# Patient Record
Sex: Female | Born: 1965 | Race: White | Hispanic: No | Marital: Married | Smoking: Never smoker
Health system: Southern US, Community
[De-identification: ages and names within clinical notes are randomized; demographics above are authoritative.]

## PROBLEM LIST (undated history)

## (undated) DIAGNOSIS — J45909 Unspecified asthma, uncomplicated: Secondary | ICD-10-CM

## (undated) DIAGNOSIS — M25519 Pain in unspecified shoulder: Secondary | ICD-10-CM

## (undated) DIAGNOSIS — L509 Urticaria, unspecified: Secondary | ICD-10-CM

## (undated) DIAGNOSIS — D229 Melanocytic nevi, unspecified: Secondary | ICD-10-CM

## (undated) DIAGNOSIS — M549 Dorsalgia, unspecified: Secondary | ICD-10-CM

## (undated) DIAGNOSIS — K589 Irritable bowel syndrome without diarrhea: Secondary | ICD-10-CM

## (undated) DIAGNOSIS — J069 Acute upper respiratory infection, unspecified: Secondary | ICD-10-CM

## (undated) HISTORY — DX: Unspecified asthma, uncomplicated: J45.909

## (undated) HISTORY — DX: Melanocytic nevi, unspecified: D22.9

## (undated) HISTORY — DX: Irritable bowel syndrome without diarrhea: K58.9

## (undated) HISTORY — DX: Urticaria, unspecified: L50.9

## (undated) HISTORY — DX: Pain in unspecified shoulder: M25.519

## (undated) HISTORY — PX: OTHER SURGICAL HISTORY: SHX169

## (undated) HISTORY — PX: CERVICAL CONE BIOPSY: SUR198

## (undated) HISTORY — DX: Acute upper respiratory infection, unspecified: J06.9

## (undated) HISTORY — DX: Dorsalgia, unspecified: M54.9

---
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
## 2006-04-07 ENCOUNTER — Other Ambulatory Visit: Admission: RE | Admit: 2006-04-07 | Discharge: 2006-04-07 | Payer: Self-pay | Admitting: Internal Medicine

## 2006-05-09 ENCOUNTER — Encounter: Admission: RE | Admit: 2006-05-09 | Discharge: 2006-05-09 | Payer: Self-pay | Admitting: Internal Medicine

## 2007-04-21 ENCOUNTER — Other Ambulatory Visit: Admission: RE | Admit: 2007-04-21 | Discharge: 2007-04-21 | Payer: Self-pay | Admitting: Family Medicine

## 2007-07-05 ENCOUNTER — Encounter: Admission: RE | Admit: 2007-07-05 | Discharge: 2007-07-05 | Payer: Self-pay | Admitting: Family Medicine

## 2008-05-14 ENCOUNTER — Other Ambulatory Visit: Admission: RE | Admit: 2008-05-14 | Discharge: 2008-05-14 | Payer: Self-pay | Admitting: Family Medicine

## 2008-07-24 ENCOUNTER — Encounter: Admission: RE | Admit: 2008-07-24 | Discharge: 2008-07-24 | Payer: Self-pay | Admitting: Family Medicine

## 2009-06-04 ENCOUNTER — Other Ambulatory Visit: Admission: RE | Admit: 2009-06-04 | Discharge: 2009-06-04 | Payer: Self-pay | Admitting: Family Medicine

## 2009-08-27 ENCOUNTER — Ambulatory Visit (HOSPITAL_COMMUNITY): Admission: RE | Admit: 2009-08-27 | Discharge: 2009-08-27 | Payer: Self-pay | Admitting: Family Medicine

## 2010-03-06 HISTORY — PX: OTHER SURGICAL HISTORY: SHX169

## 2010-10-01 ENCOUNTER — Ambulatory Visit (HOSPITAL_COMMUNITY): Admission: RE | Admit: 2010-10-01 | Discharge: 2010-10-01 | Payer: Self-pay | Admitting: Family Medicine

## 2011-08-24 ENCOUNTER — Ambulatory Visit (HOSPITAL_BASED_OUTPATIENT_CLINIC_OR_DEPARTMENT_OTHER)
Admission: RE | Admit: 2011-08-24 | Discharge: 2011-08-24 | Disposition: A | Discharge status: Home / Self Care | Payer: BC Managed Care – PPO | Source: Ambulatory Visit | Attending: Critical Care Medicine | Admitting: Critical Care Medicine

## 2011-08-24 ENCOUNTER — Encounter: Payer: Self-pay | Admitting: Critical Care Medicine

## 2011-08-24 ENCOUNTER — Ambulatory Visit (INDEPENDENT_AMBULATORY_CARE_PROVIDER_SITE_OTHER): Payer: BC Managed Care – PPO | Admitting: Critical Care Medicine

## 2011-08-24 DIAGNOSIS — K219 Gastro-esophageal reflux disease without esophagitis: Secondary | ICD-10-CM

## 2011-08-24 DIAGNOSIS — R05 Cough: Secondary | ICD-10-CM | POA: Insufficient documentation

## 2011-08-24 DIAGNOSIS — R059 Cough, unspecified: Secondary | ICD-10-CM | POA: Insufficient documentation

## 2011-08-24 DIAGNOSIS — R062 Wheezing: Secondary | ICD-10-CM | POA: Insufficient documentation

## 2011-08-24 MED ORDER — BENZONATATE 100 MG PO CAPS: ORAL_CAPSULE | ORAL | Status: AC

## 2011-08-24 MED ORDER — HYDROCODONE-HOMATROPINE 5-1.5 MG/5ML PO SYRP: 5.0000 mL | ORAL_SOLUTION | Freq: Four times a day (QID) | ORAL | Status: AC | PRN

## 2011-08-24 MED ORDER — OMEPRAZOLE 20 MG PO CPDR: 20.0000 mg | DELAYED_RELEASE_CAPSULE | Freq: Every day | ORAL | Status: DC

## 2011-08-24 MED ORDER — PREDNISONE 10 MG PO TABS: ORAL_TABLET | ORAL | Status: DC

## 2011-08-24 NOTE — Progress Notes (Signed)
Subjective:    Patient ID: Melissa Mcintyre, female    DOB: 13-Jun-1966, 45 y.o.   MRN: [id number] 45 y.o.WF with cyclical cough HPI Comments: This is the 4th episode over the past 4years , every other year.    Cough This is a recurrent problem. The current episode started 1 to 4 weeks ago. The problem has been unchanged. The problem occurs every few minutes (worse with talking ). The cough is non-productive. Associated symptoms include postnasal drip and wheezing. Pertinent negatives include no chest pain, ear congestion, ear pain, fever, headaches, heartburn, hemoptysis, myalgias, nasal congestion, rhinorrhea or sore throat. Associated symptoms comments: No dysphagia Not hoarse. The symptoms are aggravated by lying down, fumes, dust, pollens and stress. She has tried steroid inhaler, a beta-agonist inhaler and oral steroids for the symptoms. The treatment provided no relief. There is no history of asthma, bronchiectasis, bronchitis, COPD, emphysema, environmental allergies or pneumonia.   Past Medical History  Diagnosis Date  . Genital herpes   . IBS (irritable bowel syndrome)   . Atypical nevi   . Shoulder pain     intermittent  . Pain, upper back     intermittent     Family History  Problem Relation Age of Onset  . Prostate cancer Father   . Hyperlipidemia    . Hypertension Mother   . Skin cancer Mother   . Asthma Mother   . Asthma Maternal Uncle   . Asthma Paternal Uncle      History   Social History  . Marital Status: Married    Spouse Name: N/A    Number of Children: 2  . Years of Education: N/A   Occupational History  . Not on file.   Social History Main Topics  . Smoking status: Never Smoker   . Smokeless tobacco: Never Used  . Alcohol Use: Yes     2 weekly  . Drug Use: No  . Sexually Active: Not on file   Other Topics Concern  . Not on file   Social History Narrative  . No narrative on file     Allergies  Allergen Reactions  . Adhesive (Tape)   .  Demerol     vomiting  . Flagyl (Metronidazole)     Itching, joint swelling  . Latex     Blistering and rashes     Outpatient Prescriptions Prior to Visit  Medication Sig Dispense Refill  . valACYclovir (VALTREX) 500 MG tablet Take 500 mg by mouth 2 (two) times daily. For 3 days as needed for outbreak       . HYDROcodone-homatropine (HYCODAN) 5-1.5 MG/5ML syrup Take 5 mLs by mouth every 6 (six) hours as needed. For cough       . montelukast (SINGULAIR) 10 MG tablet Take 10 mg by mouth at bedtime.            Review of Systems  Constitutional: Negative for fever and fatigue.  HENT: Positive for postnasal drip. Negative for ear pain, nosebleeds, congestion, sore throat, rhinorrhea, sneezing, dental problem and sinus pressure.   Respiratory: Positive for cough and wheezing. Negative for hemoptysis, choking and chest tightness.   Cardiovascular: Negative for chest pain, palpitations and leg swelling.  Gastrointestinal: Negative for heartburn.  Musculoskeletal: Negative for myalgias.  Neurological: Negative for headaches.  Hematological: Negative for environmental allergies. Does not bruise/bleed easily.       Objective:   Physical Exam  Filed Vitals:   08/24/11 1523  BP: 112/60  Pulse: 71  Temp: 97.8 F (36.6 C)  TempSrc: Oral  Height: 5\' 1"  (1.549 m)  Weight: 110 lb (49.896 kg)  SpO2: 99%    Gen: Pleasant, well-nourished, in no distress,  normal affect  ENT: No lesions,  mouth clear,  oropharynx clear, +  postnasal drip  Neck: No JVD, no TMG, no carotid bruits  Lungs: No use of accessory muscles, no dullness to percussion, clear without rales or rhonchi, mild pseudowheeze  Cardiovascular: RRR, heart sounds normal, no murmur or gallops, no peripheral edema  Abdomen: soft and NT, no HSM,  BS normal  Musculoskeletal: No deformities, no cyanosis or clubbing  Neuro: alert, non focal  Skin: Warm, no lesions or rashes  08/24/11 CXR: NAD     Assessment & Plan:    Cough Cyclical cough syndrome with associated dyspnea likely on the basis of gastroesophageal reflux disease and postnasal drip syndrome from allergic rhinitis I doubt true asthma  Plan Stop pulmicort, singulair, xopenex Prednisone 10mg  Take 4 for two days three for two days two for two days one for two days Omeprazole one daily 1/2 hour before a meal Follow reflux diet Follow cyclic cough protocol with hycodan/tessalon pearles Pulmonary function study will be obtained Note CXR showed No active disease     Updated Medication List Outpatient Encounter Prescriptions as of 08/24/2011  Medication Sig Dispense Refill  . Ascorbic Acid (VITAMIN C) 1000 MG tablet Take 1,000 mg by mouth. 1-2 times weekly       . LYSINE PO Take by mouth as needed.        . polycarbophil (FIBERCON) 625 MG tablet Take 625 mg by mouth as needed.        . valACYclovir (VALTREX) 500 MG tablet Take 500 mg by mouth 2 (two) times daily. For 3 days as needed for outbreak       . DISCONTD: budesonide (PULMICORT) 180 MCG/ACT inhaler Inhale 1 puff into the lungs 2 (two) times daily.        Marland Kitchen DISCONTDPauline Aus HFA 45 MCG/ACT inhaler Inhale 2 puffs into the lungs Every 6 hours as needed.      . benzonatate (TESSALON) 100 MG capsule Take 1 - 2 every 4 hours per cyclic cough protocol  90 capsule  4  . HYDROcodone-homatropine (HYCODAN) 5-1.5 MG/5ML syrup Take 5 mLs by mouth every 6 (six) hours as needed for cough.  180 mL  0  . omeprazole (PRILOSEC) 20 MG capsule Take 1 capsule (20 mg total) by mouth daily.  30 capsule  1  . predniSONE (DELTASONE) 10 MG tablet Take 4 for two days three for two days two for two days one for two days  20 tablet  0  . DISCONTD: HYDROcodone-homatropine (HYCODAN) 5-1.5 MG/5ML syrup Take 5 mLs by mouth every 6 (six) hours as needed. For cough       . DISCONTD: montelukast (SINGULAIR) 10 MG tablet Take 10 mg by mouth at bedtime.

## 2011-08-24 NOTE — Patient Instructions (Addendum)
Stop pulmicort, singulair, xopenex Prednisone 10mg  Take 4 for two days three for two days two for two days one for two days Omeprazole one daily 1/2 hour before a meal Follow reflux diet Follow cyclic cough protocol with hycodan/tessalon pearles Pulmonary function study will be obtained A chest xray will be obtained Return 1 month [place]

## 2011-08-25 ENCOUNTER — Telehealth: Payer: Self-pay | Admitting: Orthopedic Surgery

## 2011-08-25 DIAGNOSIS — J4540 Moderate persistent asthma, uncomplicated: Secondary | ICD-10-CM | POA: Insufficient documentation

## 2011-08-25 NOTE — Telephone Encounter (Signed)
Called once and got disconnected. Called back and had to leave a msg to return our call.

## 2011-08-25 NOTE — Assessment & Plan Note (Addendum)
Cyclical cough syndrome with associated dyspnea likely on the basis of gastroesophageal reflux disease and postnasal drip syndrome from allergic rhinitis I doubt true asthma  Plan Stop pulmicort, singulair, xopenex Prednisone 10mg  Take 4 for two days three for two days two for two days one for two days Omeprazole one daily 1/2 hour before a meal Follow reflux diet Follow cyclic cough protocol with hycodan/tessalon pearles Pulmonary function study will be obtained Note CXR showed No active disease

## 2011-08-26 NOTE — Telephone Encounter (Signed)
Called, spoke with pt to inform her of cxr results.  She was also informed PW wants her to reduce the cough syrup to 1/2 tsp bid for one day only and reduce the prednisone to 2 tablets today for one day then one tabe for one day then stop it.  She verbalized understanding of both of these instructions.  She then stated she had already taken a tessalon pearl about 2 hours ago and wanted to know if this would affect the cough syrup.  I discussed this with PW and advised pt she can go ahead and take the cough syrup that this would not affect the tessalon pearles.  She verbalized understanding of this.    Pt is aware PW wants her to see TP tomorrow.  However, she can only come in the morning.  Spoke with Verlon Au regarding this - she will discuss with TP to see if pt can be worked in tomorrow am.  Pls advise. Thanks!

## 2011-08-26 NOTE — Progress Notes (Signed)
Quick Note:  Called, spoke with pt. She was informed cxr is normal per PW. She verbalized understanding of this. ______

## 2011-08-26 NOTE — Telephone Encounter (Signed)
PT'S MOM RETURNED CALL. PER LESLIE I ADVISED HER THAT PT CAN BE SEEN AT 10AM TOMORROW WITH TP. MOM WILL CALL PT TO LET HER KNOW. I WILL CLOSE ENCOUNTER PER LESLIE. Hazel Sams

## 2011-08-26 NOTE — Progress Notes (Signed)
Quick Note:  Notify the patient that the chest Xray is normal  ______

## 2011-08-26 NOTE — Telephone Encounter (Signed)
Call the patient :  Tell her to reduce the cough syrup to 1/2 tsp twice daily for one day only Reduce prednisone to 2 tabs today for one day then one tab per day for one day then stop  Have her come in to see tammy parrett tomorrow for recheck

## 2011-08-26 NOTE — Telephone Encounter (Signed)
Per TP, okay to use 10 am held appt time for tomorrow. I have called pt's mother, Lupita Leash, and LMTCB. Will keep spot blocked until she calls to confirm appt.

## 2011-08-26 NOTE — Telephone Encounter (Signed)
lmomtcb for donna 

## 2011-08-26 NOTE — Telephone Encounter (Signed)
Please call back asap [phone] [facility]

## 2011-08-26 NOTE — Telephone Encounter (Signed)
I spoke with pt mother and she states pt emailed her stating she is feeling some better. Mother states the pt is not able to sleep due to the prednisone. Pt is only sleep 3-4 hrs a night. Mother states the pt heads swim at night when she is in bed and is very twitchy. Mother states pt emailed her this morning advising she has had no morning cough so she thinks it is getting better. Lupita Leash (pt mother) would like Dr. Delford Field to call her directly b/c she states she has more to discuss with him. She would not elaborate on what and states just have Dr. Delford Field call her. Please advise Dr. Delford Field. Thanks  Carver Fila, CMA

## 2011-08-27 ENCOUNTER — Ambulatory Visit (INDEPENDENT_AMBULATORY_CARE_PROVIDER_SITE_OTHER): Payer: BC Managed Care – PPO | Admitting: Adult Health

## 2011-08-27 VITALS — BP 108/60 | HR 60 | Temp 97.6°F | Ht 61.0 in | Wt 109.4 lb

## 2011-08-27 DIAGNOSIS — R05 Cough: Secondary | ICD-10-CM

## 2011-08-27 DIAGNOSIS — R059 Cough, unspecified: Secondary | ICD-10-CM

## 2011-08-27 NOTE — Patient Instructions (Addendum)
Delsym 2 tsp Twice daily   Tessalon 2 capsules Three times a day   Hycodan 1-2 tsp every 4-6 hr As needed  For breakthrough  THE GOAL IS NOT COUGHING OR THROAT CLEARING  Continue On Omeprazole -Prilosec 20mg  daily -before meal  Add Pepcid 20mg  At bedtime   Voice rest Non MINT candies to prevent cough or throat clearing  Add Zyrtec 10mg  daily in am  Add Chlortab 4mg  2 tabs At bedtime   Stop prednisone  follow up Dr. Delford Field  As planned in 3 weeks.  Please contact office for sooner follow up if symptoms do not improve or worsen or seek emergency care

## 2011-08-27 NOTE — Progress Notes (Signed)
Subjective:    Patient ID: Melissa Mcintyre, female    DOB: 02-20-1966, 45 y.o.   MRN: [id number]  HPI Review of Systems Subjective:    Patient ID: Melissa Mcintyre, female    DOB: Nov 05, 1966, 45 y.o.   MRN: [id number] 45 y.o.WF with cyclical cough that began in 2008 -initial pulmonary consult on 08/24/11   08/24/11 Initial Pulmonary Consult This is a recurrent problem. The current episode started 1 to 4 weeks ago. The problem has been unchanged. The problem occurs every few minutes (worse with talking ). The cough is non-productive. Associated symptoms include postnasal drip and wheezing. Pertinent negatives include no chest pain, ear congestion, ear pain, fever, headaches, heartburn, hemoptysis, myalgias, nasal congestion, rhinorrhea or sore throat. Associated symptoms comments: No dysphagia Not hoarse. The symptoms are aggravated by lying down, fumes, dust, pollens and stress. She has tried steroid inhaler, a beta-agonist inhaler and oral steroids for the symptoms. The treatment provided no relief. There is no history of asthma, bronchiectasis, bronchitis, COPD, emphysema, environmental allergies or pneumonia.  >>started on cyclical cough protocol and inhalers were stopped.   08/27/11 Acute OV  PT returns very upset that her cough is not better. She is very emotional with crying episodes and very tearful throughout the exam. She is upset she is not sleeping, she can not stop coughing.  She is taking the steroids several times a day on the taper schedule and using tessalon and hydromet As needed  Without help. CXR recently is unremarkable. She is a never smoker.  No recent travel. No unusual hobbies.   Past Medical History  Diagnosis Date  . Genital herpes   . IBS (irritable bowel syndrome)   . Atypical nevi   . Shoulder pain     intermittent  . Pain, upper back     intermittent     Family History  Problem Relation Age of Onset  . Prostate cancer Father   . Hyperlipidemia    .  Hypertension Mother   . Skin cancer Mother   . Asthma Mother   . Asthma Maternal Uncle   . Asthma Paternal Uncle      History   Social History  . Marital Status: Married    Spouse Name: N/A    Number of Children: 2  . Years of Education: N/A   Occupational History  . Not on file.   Social History Main Topics  . Smoking status: Never Smoker   . Smokeless tobacco: Never Used  . Alcohol Use: Yes     2 weekly  . Drug Use: No  . Sexually Active: Not on file   Other Topics Concern  . Not on file   Social History Narrative  . No narrative on file     Allergies  Allergen Reactions  . Adhesive (Tape)   . Demerol     vomiting  . Flagyl (Metronidazole)     Itching, joint swelling  . Latex     Blistering and rashes     Outpatient Prescriptions Prior to Visit  Medication Sig Dispense Refill  . valACYclovir (VALTREX) 500 MG tablet Take 500 mg by mouth 2 (two) times daily. For 3 days as needed for outbreak       . HYDROcodone-homatropine (HYCODAN) 5-1.5 MG/5ML syrup Take 5 mLs by mouth every 6 (six) hours as needed. For cough       . montelukast (SINGULAIR) 10 MG tablet Take 10 mg by mouth at bedtime.  Review of Systems  Constitutional: Negative for fever and fatigue.  HENT: Positive for postnasal drip. Negative for ear pain, nosebleeds, congestion, sore throat, rhinorrhea, sneezing, dental problem and sinus pressure.  + throat clearing  Respiratory: Positive for cough -dry . Negative for hemoptysis, choking and chest tightness.   Cardiovascular: Negative for chest pain, palpitations and leg swelling.  Gastrointestinal: Negative for heartburn.  Musculoskeletal: Negative for myalgias.  Neurological: Negative for headaches.  Hematological: Negative for environmental allergies. Does not bruise/bleed easily.       Objective:   Physical Exam    Gen: Pleasant, well-nourished, in no distress, tearful   ENT: No lesions,  mouth clear,  oropharynx clear, +   postnasal drip  Neck: No JVD, no TMG, no carotid bruits  Lungs: No use of accessory muscles, no dullness to percussion, coarse BS no wheezing   Cardiovascular: RRR, heart sounds normal, no murmur or gallops, no peripheral edema  Abdomen: soft and NT, no HSM,  BS normal  Musculoskeletal: No deformities, no cyanosis or clubbing  Neuro: alert, non focal  Skin: Warm, no lesions or rashes  08/24/11 CXR: NAD     Assessment & Plan:   Cough Cyclical cough syndrome with associated dyspnea likely on the basis of gastroesophageal reflux disease and postnasal drip syndrome from allergic rhinitis I doubt true asthma  Plan Stop pulmicort, singulair, xopenex Prednisone 10mg  Take 4 for two days three for two days two for two days one for two days Omeprazole one daily 1/2 hour before a meal Follow reflux diet Follow cyclic cough protocol with hycodan/tessalon pearles Pulmonary function study will be obtained Note CXR showed No active disease     Updated Medication List Outpatient Encounter Prescriptions as of 08/24/2011  Medication Sig Dispense Refill  . Ascorbic Acid (VITAMIN C) 1000 MG tablet Take 1,000 mg by mouth. 1-2 times weekly       . LYSINE PO Take by mouth as needed.        . polycarbophil (FIBERCON) 625 MG tablet Take 625 mg by mouth as needed.        . valACYclovir (VALTREX) 500 MG tablet Take 500 mg by mouth 2 (two) times daily. For 3 days as needed for outbreak       . DISCONTD: budesonide (PULMICORT) 180 MCG/ACT inhaler Inhale 1 puff into the lungs 2 (two) times daily.        Marland Kitchen DISCONTDPauline Aus HFA 45 MCG/ACT inhaler Inhale 2 puffs into the lungs Every 6 hours as needed.      . benzonatate (TESSALON) 100 MG capsule Take 1 - 2 every 4 hours per cyclic cough protocol  90 capsule  4  . HYDROcodone-homatropine (HYCODAN) 5-1.5 MG/5ML syrup Take 5 mLs by mouth every 6 (six) hours as needed for cough.  180 mL  0  . omeprazole (PRILOSEC) 20 MG capsule Take 1 capsule (20 mg total)  by mouth daily.  30 capsule  1  . predniSONE (DELTASONE) 10 MG tablet Take 4 for two days three for two days two for two days one for two days  20 tablet  0  . DISCONTD: HYDROcodone-homatropine (HYCODAN) 5-1.5 MG/5ML syrup Take 5 mLs by mouth every 6 (six) hours as needed. For cough       . DISCONTD: montelukast (SINGULAIR) 10 MG tablet Take 10 mg by mouth at bedtime.                Objective:   Physical Exam  Assessment & Plan:

## 2011-08-28 NOTE — Assessment & Plan Note (Addendum)
Cyclical cough with unremarkable cxr in a never smoker.  Pt has upcoming PFTS  Will aim tx at cough control with GERD and Rhnitis prevention   Plan;  Delsym 2 tsp Twice daily   Tessalon 2 capsules Three times a day   Hycodan 1-2 tsp every 4-6 hr As needed  For breakthrough  THE GOAL IS NOT COUGHING OR THROAT CLEARING  Continue On Omeprazole -Prilosec 20mg  daily -before meal  Add Pepcid 20mg  At bedtime   Voice rest Non MINT candies to prevent cough or throat clearing  Add Zyrtec 10mg  daily in am  Add Chlortab 4mg  2 tabs At bedtime   Stop prednisone (unable to tolerate due to mood and sleep diprivation) follow up Dr. Delford Field  As planned in 3 weeks.  Please contact office for sooner follow up if symptoms do not improve or worsen or seek emergency care

## 2011-08-30 ENCOUNTER — Telehealth: Payer: Self-pay | Admitting: Adult Health

## 2011-08-30 DIAGNOSIS — R05 Cough: Secondary | ICD-10-CM

## 2011-08-30 DIAGNOSIS — R059 Cough, unspecified: Secondary | ICD-10-CM

## 2011-08-30 NOTE — Telephone Encounter (Signed)
I spoke with pt and she states she has been feeling dizzy and lightheaded x Friday. Pt states she is not sure which medication is causing this. She states she did not take any cough syrup yesterday to see if that was the cause and she states it was not. Pt states she is taking delsym cough syrup, tessalon pearles, zyrtec, chlorpheniramine tablets at bedtime. Pt is wanting to see what Dr. Delford Field thinks may be causing this. Please advise Dr. Delford Field. Thanks  Carver Fila, CMA

## 2011-08-30 NOTE — Telephone Encounter (Signed)
I spoke to pt and told her to:  Use delsym only prn Stay on tessalon Use zyrtec prn Stay on pepcid/omeprazole Stay on chlorpheniramine  I want her to get pfts asap and moved her order up

## 2011-08-30 NOTE — Telephone Encounter (Signed)
Called, spoke with pt.  She is aware PW wants her to move PFTs up and have them done this week.  This was scheduled for this Wednesday, Sept 26 at 1 pm at [place] office -- pt aware and verbalized understanding of this.

## 2011-09-01 ENCOUNTER — Ambulatory Visit (INDEPENDENT_AMBULATORY_CARE_PROVIDER_SITE_OTHER): Payer: BC Managed Care – PPO | Admitting: Critical Care Medicine

## 2011-09-01 DIAGNOSIS — R05 Cough: Secondary | ICD-10-CM

## 2011-09-01 DIAGNOSIS — R059 Cough, unspecified: Secondary | ICD-10-CM

## 2011-09-01 LAB — PULMONARY FUNCTION TEST

## 2011-09-01 NOTE — Progress Notes (Signed)
PFT done today. 

## 2011-09-02 ENCOUNTER — Telehealth: Payer: Self-pay | Admitting: Critical Care Medicine

## 2011-09-02 DIAGNOSIS — R059 Cough, unspecified: Secondary | ICD-10-CM

## 2011-09-02 DIAGNOSIS — R05 Cough: Secondary | ICD-10-CM

## 2011-09-02 NOTE — Telephone Encounter (Signed)
Pfts show possible asthma I left msg on cell phone  Pls make sure pt has Rov next week to regroup with pft results, [place] office ok if she can come here to [place]

## 2011-09-03 NOTE — Telephone Encounter (Signed)
ATC again, NA and unable to leave a msg

## 2011-09-03 NOTE — Telephone Encounter (Signed)
Called pt at 09/03/2011 1638 , no answer x 2 . Unable to leave message  Please call back and notify to follow up as Dr. Delford Field  Recommended.

## 2011-09-03 NOTE — Telephone Encounter (Signed)
Spoke with pt.  She is asking whether or not to take tessalon pearles. I asked her if she was still coughing and she states only occ. I advised that she can use the med as needed for the cough, but if not coughing she does not have to take. She asks if needs to start inhaler b/c she has been dxed with asthma. Looking at the result note per PW we are not sure she has asthma and since he has not old her to start inhaler yet, she should wait until ov with PW. She verbalized understanding and states will keep appt.

## 2011-09-03 NOTE — Telephone Encounter (Signed)
Called, spoke with pt.  States she did get Dr. Lynelle Doctor message but was very upset because he was supposed to call her on the [phone] number and bc he called the number listed as her cell phone number, which is actually her home number, she did not get a chance to actually speak with him.  I apologized for this and informed pt of PW's statement.  She verbalized understanding of this and went ahead and scheduled f/u with PW for 09/09/11 in [place] but she wanting to speak with PW in the meantime regarding this because she thinks she might need to go back on her inhaler - states she is still having dizziness off and on during the day and coughing spells.  I advised pt that PW was not back in the office until Wednesday but she would still like to discuss this with someone prior to her appt.  Tammy, you seen pt on 08/30/11 - she had the PFTs done on Wednesday which I have with me.  Pt wanting to speak with PW but he is off -- will you call her to see if you can help her with her questions regarding inhaler?  I tried to get more information out of her regarding this but she would rather speak with someone instead of going "back and forth."  She is requesting call back on the [phone].  Thank you.

## 2011-09-09 ENCOUNTER — Encounter: Payer: Self-pay | Admitting: Critical Care Medicine

## 2011-09-09 ENCOUNTER — Ambulatory Visit (INDEPENDENT_AMBULATORY_CARE_PROVIDER_SITE_OTHER): Payer: BC Managed Care – PPO | Admitting: Critical Care Medicine

## 2011-09-09 VITALS — BP 110/60 | HR 55 | Temp 98.3°F | Ht 61.0 in | Wt 109.0 lb

## 2011-09-09 DIAGNOSIS — J45909 Unspecified asthma, uncomplicated: Secondary | ICD-10-CM

## 2011-09-09 MED ORDER — AEROCHAMBER MV MISC: Status: AC

## 2011-09-09 MED ORDER — LEVALBUTEROL TARTRATE 45 MCG/ACT IN AERO: 2.0000 | INHALATION_SPRAY | RESPIRATORY_TRACT | Status: AC | PRN

## 2011-09-09 MED ORDER — FLUTICASONE FUROATE 27.5 MCG/SPRAY NA SUSP: 2.0000 | Freq: Every day | NASAL | Status: DC

## 2011-09-09 MED ORDER — METHYLPREDNISOLONE ACETATE 80 MG/ML IJ SUSP
120.0000 mg | Freq: Once | INTRAMUSCULAR | Status: AC
  Administered 2011-09-09: 120 mg via INTRAMUSCULAR

## 2011-09-09 MED ORDER — BECLOMETHASONE DIPROPIONATE 80 MCG/ACT IN AERS: 2.0000 | INHALATION_SPRAY | Freq: Two times a day (BID) | RESPIRATORY_TRACT | Status: DC

## 2011-09-09 NOTE — Progress Notes (Deleted)
Subjective:    Patient ID: Melissa Mcintyre, female    DOB: 07-Jun-1966, 45 y.o.   MRN: [id number]  HPI  Review of Systems  Subjective:    Patient ID: Melissa Mcintyre, female    DOB: February 24, 1966, 45 y.o.   MRN: [id number] 45 y.o.WF with cyclical cough that began in 2008 -initial pulmonary consult on 08/24/11   08/24/11 Initial Pulmonary Consult This is a recurrent problem. The current episode started 1 to 4 weeks ago. The problem has been unchanged. The problem occurs every few minutes (worse with talking ). The cough is non-productive. Associated symptoms include postnasal drip and wheezing. Pertinent negatives include no chest pain, ear congestion, ear pain, fever, headaches, heartburn, hemoptysis, myalgias, nasal congestion, rhinorrhea or sore throat. Associated symptoms comments: No dysphagia Not hoarse. The symptoms are aggravated by lying down, fumes, dust, pollens and stress. She has tried steroid inhaler, a beta-agonist inhaler and oral steroids for the symptoms. The treatment provided no relief. There is no history of asthma, bronchiectasis, bronchitis, COPD, emphysema, environmental allergies or pneumonia.  >>started on cyclical cough protocol and inhalers were stopped.   08/27/11 Acute OV  PT returns very upset that her cough is not better. She is very emotional with crying episodes and very tearful throughout the exam. She is upset she is not sleeping, she can not stop coughing.  She is taking the steroids several times a day on the taper schedule and using tessalon and hydromet As needed  Without help. CXR recently is unremarkable. She is a never smoker.  No recent travel. No unusual hobbies.   09/09/2011  Past Medical History  Diagnosis Date  . Genital herpes   . IBS (irritable bowel syndrome)   . Atypical nevi   . Shoulder pain     intermittent  . Pain, upper back     intermittent     Family History  Problem Relation Age of Onset  . Prostate cancer Father   .  Hyperlipidemia    . Hypertension Mother   . Skin cancer Mother   . Asthma Mother   . Asthma Maternal Uncle   . Asthma Paternal Uncle      History   Social History  . Marital Status: Married    Spouse Name: N/A    Number of Children: 2  . Years of Education: N/A   Occupational History  . Not on file.   Social History Main Topics  . Smoking status: Never Smoker   . Smokeless tobacco: Never Used  . Alcohol Use: Yes     2 weekly  . Drug Use: No  . Sexually Active: Not on file   Other Topics Concern  . Not on file   Social History Narrative  . No narrative on file     Allergies  Allergen Reactions  . Adhesive (Tape)   . Demerol     vomiting  . Flagyl (Metronidazole)     Itching, joint swelling  . Latex     Blistering and rashes     Outpatient Prescriptions Prior to Visit  Medication Sig Dispense Refill  . valACYclovir (VALTREX) 500 MG tablet Take 500 mg by mouth 2 (two) times daily. For 3 days as needed for outbreak       . HYDROcodone-homatropine (HYCODAN) 5-1.5 MG/5ML syrup Take 5 mLs by mouth every 6 (six) hours as needed. For cough       . montelukast (SINGULAIR) 10 MG tablet Take 10 mg by mouth at bedtime.  Review of Systems  Constitutional: Negative for fever and fatigue.  HENT: Positive for postnasal drip. Negative for ear pain, nosebleeds, congestion, sore throat, rhinorrhea, sneezing, dental problem and sinus pressure.  + throat clearing  Respiratory: Positive for cough -dry . Negative for hemoptysis, choking and chest tightness.   Cardiovascular: Negative for chest pain, palpitations and leg swelling.  Gastrointestinal: Negative for heartburn.  Musculoskeletal: Negative for myalgias.  Neurological: Negative for headaches.  Hematological: Negative for environmental allergies. Does not bruise/bleed easily.       Objective:   Physical Exam    Gen: Pleasant, well-nourished, in no distress, tearful   ENT: No lesions,  mouth clear,   oropharynx clear, +  postnasal drip  Neck: No JVD, no TMG, no carotid bruits  Lungs: No use of accessory muscles, no dullness to percussion, coarse BS no wheezing   Cardiovascular: RRR, heart sounds normal, no murmur or gallops, no peripheral edema  Abdomen: soft and NT, no HSM,  BS normal  Musculoskeletal: No deformities, no cyanosis or clubbing  Neuro: alert, non focal  Skin: Warm, no lesions or rashes  08/24/11 CXR: NAD     Assessment & Plan:   Cough Cyclical cough syndrome with associated dyspnea likely on the basis of gastroesophageal reflux disease and postnasal drip syndrome from allergic rhinitis I doubt true asthma  Plan Stop pulmicort, singulair, xopenex Prednisone 10mg  Take 4 for two days three for two days two for two days one for two days Omeprazole one daily 1/2 hour before a meal Follow reflux diet Follow cyclic cough protocol with hycodan/tessalon pearles Pulmonary function study will be obtained Note CXR showed No active disease     Updated Medication List Outpatient Encounter Prescriptions as of 08/24/2011  Medication Sig Dispense Refill  . Ascorbic Acid (VITAMIN C) 1000 MG tablet Take 1,000 mg by mouth. 1-2 times weekly       . LYSINE PO Take by mouth as needed.        . polycarbophil (FIBERCON) 625 MG tablet Take 625 mg by mouth as needed.        . valACYclovir (VALTREX) 500 MG tablet Take 500 mg by mouth 2 (two) times daily. For 3 days as needed for outbreak       . DISCONTD: budesonide (PULMICORT) 180 MCG/ACT inhaler Inhale 1 puff into the lungs 2 (two) times daily.        Marland Kitchen DISCONTDPauline Aus HFA 45 MCG/ACT inhaler Inhale 2 puffs into the lungs Every 6 hours as needed.      . benzonatate (TESSALON) 100 MG capsule Take 1 - 2 every 4 hours per cyclic cough protocol  90 capsule  4  . HYDROcodone-homatropine (HYCODAN) 5-1.5 MG/5ML syrup Take 5 mLs by mouth every 6 (six) hours as needed for cough.  180 mL  0  . omeprazole (PRILOSEC) 20 MG capsule Take 1  capsule (20 mg total) by mouth daily.  30 capsule  1  . predniSONE (DELTASONE) 10 MG tablet Take 4 for two days three for two days two for two days one for two days  20 tablet  0  . DISCONTD: HYDROcodone-homatropine (HYCODAN) 5-1.5 MG/5ML syrup Take 5 mLs by mouth every 6 (six) hours as needed. For cough       . DISCONTD: montelukast (SINGULAIR) 10 MG tablet Take 10 mg by mouth at bedtime.                Objective:   Physical Exam  Assessment & Plan:

## 2011-09-09 NOTE — Assessment & Plan Note (Signed)
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 Need to more fully evaluate allergic factors Note pt failed stopping ICS and singulair and cyclical cough protocol with PPI Plan Start Qvar 80 HFA with aerochamber two puff bid Use xopenex on schedule shortterm then prn Depomedrol 120mg  IM veramyst nasal spray daily Allergy eval dr young

## 2011-09-09 NOTE — Progress Notes (Signed)
Subjective:    Patient ID: Melissa Mcintyre, female    DOB: 03-06-1966, 45 y.o.   MRN: [id number]  HPI 45 y.o. WF with cyclical cough that began in 2008 -initial pulmonary consult on 08/24/11   08/24/11 Initial Pulmonary Consult This is a recurrent problem. The current episode started 1 to 4 weeks ago. The problem has been unchanged. The problem occurs every few minutes (worse with talking ). The cough is non-productive. Associated symptoms include postnasal drip and wheezing. Pertinent negatives include no chest pain, ear congestion, ear pain, fever, headaches, heartburn, hemoptysis, myalgias, nasal congestion, rhinorrhea or sore throat. Associated symptoms comments: No dysphagia Not hoarse. The symptoms are aggravated by lying down, fumes, dust, pollens and stress. She has tried steroid inhaler, a beta-agonist inhaler and oral steroids for the symptoms. The treatment provided no relief. There is no history of asthma, bronchiectasis, bronchitis, COPD, emphysema, environmental allergies or pneumonia.  >>started on cyclical cough protocol and inhalers were stopped.   08/27/11 Acute OV  PT returns very upset that her cough is not better. She is very emotional with crying episodes and very tearful throughout the exam. She is upset she is not sleeping, she can not stop coughing.  She is taking the steroids several times a day on the taper schedule and using tessalon and hydromet As needed  Without help. CXR recently is unremarkable. She is a never smoker.  No recent travel. No unusual hobbies.   09/09/2011 Pt unimproved. She did not tolerate the cyclical cough protocol and PPI did not help. PFTs show reactive airway disease. Pt off all inhalers now.  Pt desires an allergy eval.  Prednisone caused significant side effects. No f/c/s.     Review of Systems Constitutional:   No  weight loss, night sweats,  Fevers, chills, fatigue, lassitude. HEENT:   No headaches,  Difficulty swallowing,  Tooth/dental  problems,  Sore throat,                No sneezing, itching, ear ache, nasal congestion, +  post nasal drip,   CV:  No chest pain,  Orthopnea, PND, swelling in lower extremities, anasarca, dizziness, palpitations  GI  No heartburn, indigestion, abdominal pain, nausea, vomiting, diarrhea, change in bowel habits, loss of appetite  Resp: Notes  shortness of breath with exertion and  at rest.  No excess mucus, no productive cough,  Notes non-productive cough,  No coughing up of blood.  No change in color of mucus.  Notes wheezing.  No chest wall deformity  Skin: no rash or lesions.  GU: no dysuria, change in color of urine, no urgency or frequency.  No flank pain.  MS:  No joint pain or swelling.  No decreased range of motion.  No back pain.  Psych:  No change in mood or affect. No depression or anxiety.  No memory loss.     Objective:   Physical Exam Filed Vitals:   09/09/11 1027  BP: 110/60  Pulse: 55  Temp: 98.3 F (36.8 C)  TempSrc: Oral  Height: 5\' 1"  (1.549 m)  Weight: 109 lb (49.442 kg)  SpO2: 99%    Gen: angry, agitated WF , in no distress,    ENT: No lesions,  mouth clear,  oropharynx clear, no postnasal drip  Neck: No JVD, no TMG, no carotid bruits  Lungs: No use of accessory muscles, no dullness to percussion, clear without rales or rhonchi, exp wheeze to force exhalation, does not appear to be pseudowheeze  Cardiovascular:  RRR, heart sounds normal, no murmur or gallops, no peripheral edema  Abdomen: soft and NT, no HSM,  BS normal  Musculoskeletal: No deformities, no cyanosis or clubbing  Neuro: alert, non focal  Skin: Warm, no lesions or rashes   PFT Conversion 09/09/2011  FVC 3  FVC PREDICT 3.08  FVC  % Predicted 97  FEV1 2.48  FEV1 PREDICT 2.37  FEV % Predicted 104  FEV1/FVC 82.7  FEV1/FVC PRE 76  FeF 25-75 2.85  FeF 25-75 % Predicted 99  Residual Volume (ml) 1.61  RV % EXPECT 110  DLCO (ml/mmHg sec) 17.3  DLCO % EXPEC 97  DLCO/VA 4.57    DLCO/VA%EXP 111  Note 86% improvement with xopenex in Fef 25-=75 after BD     Assessment & Plan:   Extrinsic asthma, unspecified 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 Need to more fully evaluate allergic factors Note pt failed stopping ICS and singulair and cyclical cough protocol with PPI Plan Start Qvar 80 HFA with aerochamber two puff bid Use xopenex on schedule shortterm then prn Depomedrol 120mg  IM veramyst nasal spray daily Allergy eval dr young      Updated Medication List Outpatient Encounter Prescriptions as of 09/09/2011  Medication Sig Dispense Refill  . Ascorbic Acid (VITAMIN C) 1000 MG tablet Take 1,000 mg by mouth. 1-2 times weekly       . polycarbophil (FIBERCON) 625 MG tablet Take 625 mg by mouth as needed.        . valACYclovir (VALTREX) 500 MG tablet Take 500 mg by mouth 2 (two) times daily. For 3 days as needed for outbreak       . beclomethasone (QVAR) 80 MCG/ACT inhaler Inhale 2 puffs into the lungs 2 (two) times daily.  1 Inhaler  6  . fluticasone (VERAMYST) 27.5 MCG/SPRAY nasal spray Place 2 sprays into the nose daily.  10 g  1  . levalbuterol (XOPENEX HFA) 45 MCG/ACT inhaler Inhale 2 puffs into the lungs every 4 (four) hours as needed for wheezing.  1 Inhaler  2  . Spacer/Aero-Holding Chambers (AEROCHAMBER MV) inhaler Use as instructed  1 each  0  . DISCONTD: LYSINE PO Take by mouth as needed.        Marland Kitchen DISCONTD: omeprazole (PRILOSEC) 20 MG capsule Take 1 capsule (20 mg total) by mouth daily.  30 capsule  1  . DISCONTD: predniSONE (DELTASONE) 10 MG tablet Take 4 for two days three for two days two for two days one for two days  20 tablet  0   Facility-Administered Encounter Medications as of 09/09/2011  Medication Dose Route Frequency  Provider Last Rate Last Dose  . methylPREDNISolone acetate (DEPO-MEDROL) injection 120 mg  120 mg Intramuscular Once Shan Levans, MD   120 mg at 09/09/11 1131

## 2011-09-09 NOTE — Patient Instructions (Signed)
A depo medrol injection was given 120mg   Start Qvar two puff twice daily, use aerochamber Use xopenex two puff three times daily for 3 days then reduce to as needed Start Veramyst two puff each nostril daily Stop omeprazole Stop all cough syrup An allergy evaluation will be obtained with Dr Maple Hudson Return [facility] one month

## 2011-09-15 ENCOUNTER — Other Ambulatory Visit: Payer: Self-pay | Admitting: *Deleted

## 2011-09-15 DIAGNOSIS — J45909 Unspecified asthma, uncomplicated: Secondary | ICD-10-CM

## 2011-09-15 MED ORDER — BECLOMETHASONE DIPROPIONATE 80 MCG/ACT IN AERS: 2.0000 | INHALATION_SPRAY | Freq: Two times a day (BID) | RESPIRATORY_TRACT | Status: DC

## 2011-09-15 NOTE — Progress Notes (Signed)
Received a refill error from [facility] regarding qvar 80.  I called and spoke with [facility] with [employer].  He states they did not receive the qvar rx sent on 09/09/11 and do not have a qvar rx on file for pt.  I gave verbal for this qvar 80 2 puffs bid # 1 x 6.  Barbara Cower verbalized understanding of this.

## 2011-09-20 ENCOUNTER — Ambulatory Visit: Payer: BC Managed Care – PPO | Admitting: Critical Care Medicine

## 2011-10-04 ENCOUNTER — Other Ambulatory Visit: Payer: BC Managed Care – PPO

## 2011-10-04 ENCOUNTER — Ambulatory Visit (INDEPENDENT_AMBULATORY_CARE_PROVIDER_SITE_OTHER): Payer: BC Managed Care – PPO | Admitting: Internal Medicine

## 2011-10-04 ENCOUNTER — Other Ambulatory Visit (HOSPITAL_COMMUNITY)
Admission: RE | Admit: 2011-10-04 | Discharge: 2011-10-04 | Disposition: A | Discharge status: Home / Self Care | Payer: BC Managed Care – PPO | Source: Ambulatory Visit | Attending: Family Medicine | Admitting: Family Medicine

## 2011-10-04 ENCOUNTER — Other Ambulatory Visit: Payer: Self-pay | Admitting: Family Medicine

## 2011-10-04 ENCOUNTER — Encounter: Payer: Self-pay | Admitting: Internal Medicine

## 2011-10-04 VITALS — BP 106/54 | HR 61 | Ht 61.0 in | Wt 111.6 lb

## 2011-10-04 DIAGNOSIS — J45909 Unspecified asthma, uncomplicated: Secondary | ICD-10-CM

## 2011-10-04 DIAGNOSIS — Z0000 Encounter for general adult medical examination without abnormal findings: Secondary | ICD-10-CM | POA: Insufficient documentation

## 2011-10-04 NOTE — Patient Instructions (Signed)
Order- lab  Allergy profile  Dx asthma Schedule return for allergy skin testing off of antihistamines, including otc sleep meds, cough and cold products and cough syrups. Your current asthma meds are ok.

## 2011-10-04 NOTE — Progress Notes (Signed)
10/04/11- 45 year old never smoker referred by Dr. Delford Field for allergy evaluation because of asthma. She has never considered herself and allergic person. She has always had cats and dogs and has a dog now. Home has a basement with musty mildew odor but mold inspection was negative. She denies history of sinus infection otitis, or unusual sensitivity to insects, food, latex or aspirin. She now notices persistent itch and dry cough felt deep to her sternum and mid chest. She has noticed this in August of recent years but this year it has persisted. This is the fourth stretch of such cough in the last few years. Almost always the trigger has seemed to be a bad cold, but cough lingers for months afterwards. The pattern began 6 or 7 years ago. She does not recognize a clear relation to season, weather or exposures. Mother had asthma. 2 uncles had adult onset asthma.  ROS see HPI Constitutional:   No-   weight loss, night sweats, fevers, chills, fatigue, lassitude. HEENT:   No-  headaches, difficulty swallowing, tooth/dental problems, sore throat,       No-  sneezing, itching, ear ache, nasal congestion, post nasal drip,  CV:  No-   chest pain, orthopnea, PND, swelling in lower extremities, anasarca, dizziness, palpitations Resp: No-   shortness of breath with exertion or at rest.              No-   productive cough,  Persistent non-productive cough,  No- coughing up of blood.              No-   change in color of mucus.  No- wheezing.   Skin: No-   rash or lesions. GI:  No-   heartburn, indigestion, abdominal pain, nausea, vomiting, diarrhea,                 change in bowel habits, loss of appetite GU: No-   dysuria, change in color of urine, no urgency or frequency.  No- flank pain. MS:  No-   joint pain or swelling.  No- decreased range of motion.  No- back pain. Neuro-     nothing unusual Psych:  No- change in mood or affect. No depression or anxiety.  No memory loss.  OBJ General- Alert,  Oriented, Affect-appropriate, Distress- none acute, healthy appearing. Skin- rash-none, lesions- none, excoriation- none Lymphadenopathy- none Head- atraumatic            Eyes- Gross vision intact, PERRLA, conjunctivae clear secretions            Ears- Hearing, canals-normal            Nose- Clear, no-Septal dev, mucus, polyps, erosion, perforation             Throat- Mallampati II , mucosa clear , drainage- none, tonsils- atrophic Neck- flexible , trachea midline, no stridor , thyroid nl, carotid no bruit Chest - symmetrical excursion , unlabored           Heart/CV- RRR , no murmur , no gallop  , no rub, nl s1 s2                           - JVD- none , edema- none, stasis changes- none, varices- none           Lung- Clear, but with a persistent slight dry cough. Not wheezing., dullness-none, rub- none           Chest wall-  Abd- tender-no, distended-no, bowel sounds-present, HSM- no Br/ Gen/ Rectal- Not done, not indicated Extrem- cyanosis- none, clubbing, none, atrophy- none, strength- nl Neuro- grossly intact to observation

## 2011-10-04 NOTE — Assessment & Plan Note (Signed)
Adult onset asthma/ cough, consistent with the proposed cyclical mechanism. We are asked to look for a contributing atopic component, and I note her description of itching in chest.  We will send IgE assay now and return off antihistamines for allergy skin test.

## 2011-10-05 LAB — ALLERGY FULL PROFILE
Allergen, D pternoyssinus,d7: <0.1 kU/L (ref <0.35)
Aspergillus fumigatus, IgG: <0.1 kU/L (ref <0.35)
Bahia Grass: <0.1 kU/L (ref <0.35)
Box Elder IgE: <0.1 kU/L (ref <0.35)
D. farinae: <0.1 kU/L (ref <0.35)
Dog Dander: <0.1 kU/L (ref <0.35)
Elm IgE: <0.1 kU/L (ref <0.35)
G005 Rye, Perennial: <0.1 kU/L (ref <0.35)
Goldenrod: <0.1 kU/L (ref <0.35)
Helminthosporium halodes: <0.1 kU/L (ref <0.35)
House Dust Hollister: <0.1 kU/L (ref <0.35)
IgE (Immunoglobulin E), Serum: 13.3 IU/mL (ref 0.0–180.0)
Lamb's Quarters: <0.1 kU/L (ref <0.35)
Plantain: <0.1 kU/L (ref <0.35)

## 2011-10-11 NOTE — Progress Notes (Signed)
Quick Note:  Pt aware of results. ______ 

## 2011-10-13 ENCOUNTER — Other Ambulatory Visit (HOSPITAL_COMMUNITY): Payer: Self-pay | Admitting: Family Medicine

## 2011-10-13 DIAGNOSIS — Z1231 Encounter for screening mammogram for malignant neoplasm of breast: Secondary | ICD-10-CM

## 2011-10-14 ENCOUNTER — Ambulatory Visit: Payer: BC Managed Care – PPO | Admitting: Critical Care Medicine

## 2011-10-19 ENCOUNTER — Ambulatory Visit (HOSPITAL_COMMUNITY)
Admission: RE | Admit: 2011-10-19 | Discharge: 2011-10-19 | Disposition: A | Discharge status: Home / Self Care | Payer: BC Managed Care – PPO | Source: Ambulatory Visit | Attending: Family Medicine | Admitting: Family Medicine

## 2011-10-19 DIAGNOSIS — Z1231 Encounter for screening mammogram for malignant neoplasm of breast: Secondary | ICD-10-CM | POA: Insufficient documentation

## 2011-11-10 ENCOUNTER — Ambulatory Visit: Payer: BC Managed Care – PPO | Admitting: Internal Medicine

## 2011-11-18 ENCOUNTER — Ambulatory Visit: Payer: BC Managed Care – PPO | Admitting: Critical Care Medicine

## 2011-12-15 ENCOUNTER — Telehealth: Payer: Self-pay | Admitting: Internal Medicine

## 2011-12-15 NOTE — Telephone Encounter (Signed)
ATC NA WCB 

## 2011-12-16 ENCOUNTER — Telehealth: Payer: Self-pay | Admitting: Internal Medicine

## 2011-12-16 NOTE — Telephone Encounter (Signed)
LMOM for pt TCB 

## 2011-12-16 NOTE — Telephone Encounter (Signed)
Per Florentina Addison no sooner work in for allergy testing. I made pt aware of this and voiced her understanding and had no questions

## 2011-12-16 NOTE — Telephone Encounter (Signed)
I spoke with pt and she states she needed to reschedule her allergy testing. I transferred pt back to girls upfront to schedule reschedule this

## 2011-12-16 NOTE — Telephone Encounter (Signed)
ATC NA WCB 

## 2012-01-12 ENCOUNTER — Ambulatory Visit: Payer: BC Managed Care – PPO | Admitting: Internal Medicine

## 2012-02-09 ENCOUNTER — Other Ambulatory Visit: Payer: Self-pay | Admitting: Dermatology

## 2012-04-05 ENCOUNTER — Encounter: Payer: Self-pay | Admitting: Internal Medicine

## 2012-04-05 ENCOUNTER — Ambulatory Visit (INDEPENDENT_AMBULATORY_CARE_PROVIDER_SITE_OTHER): Payer: BC Managed Care – PPO | Admitting: Internal Medicine

## 2012-04-05 VITALS — BP 110/68 | HR 63 | Ht 61.0 in | Wt 115.0 lb

## 2012-04-05 DIAGNOSIS — J45909 Unspecified asthma, uncomplicated: Secondary | ICD-10-CM

## 2012-04-05 NOTE — Patient Instructions (Signed)
Consider the dust and pollen control measures we discussed.  I will be happy to see you again as needed.

## 2012-04-05 NOTE — Progress Notes (Signed)
10/04/11- 46 year old never smoker referred by Dr. Delford Field for allergy evaluation because of asthma. She has never considered herself and allergic person. She has always had cats and dogs and has a dog now. Home has a basement with musty mildew odor but mold inspection was negative. She denies history of sinus infection otitis, or unusual sensitivity to insects, food, latex or aspirin. She now notices persistent itch and dry cough felt deep to her sternum and mid chest. She has noticed this in August of recent years but this year it has persisted. This is the fourth stretch of such cough in the last few years. Almost always the trigger has seemed to be a bad cold, but cough lingers for months afterwards. The pattern began 6 or 7 years ago. She does not recognize a clear relation to season, weather or exposures. Mother had asthma. 2 uncles had adult onset asthma.  04/05/12-  68 year old never smoker referred by Dr. Delford Field for allergy evaluation because of asthma. Comes now wishing allergy skin testing. She had had to reschedule several times. Peak season is Fall, with what appear to be prolonged bouts of asthmatic bronchitis lasting weeks to months. She has been well this Spring, noting many around her complaining of allergies. Allergy Profile 10/04/11- Total IgE 13.3, no specific elevations.  Allergy Skin Test-mild to moderate positives particularly for mixed grass, weed and tree pollens, dust mite and some molds.  ROS see HPI Constitutional:   No-   weight loss, night sweats, fevers, chills, fatigue, lassitude. HEENT:   No-  headaches, difficulty swallowing, tooth/dental problems, sore throat,       No-  sneezing, itching, ear ache, nasal congestion, post nasal drip,  CV:  No-   chest pain, orthopnea, PND, swelling in lower extremities, anasarca, dizziness, palpitations Resp: No-   shortness of breath with exertion or at rest.              No-   productive cough,  Persistent non-productive cough,  No-  coughing up of blood.              No-   change in color of mucus.  No- wheezing.   Skin: No-   rash or lesions. GI:  No-   heartburn, indigestion, abdominal pain, nausea, vomiting,  GU:  MS:  No-   joint pain or swelling.   Neuro-     nothing unusual Psych:  No- change in mood or affect. No depression or anxiety.  No memory loss.  OBJ General- Alert, Oriented, Affect-appropriate, Distress- none acute, healthy appearing. Skin- rash-none, lesions- none, excoriation- none Lymphadenopathy- none Head- atraumatic            Eyes- Gross vision intact, PERRLA, conjunctivae clear secretions            Ears- Hearing, canals-normal            Nose- Clear, no-Septal dev, mucus, polyps, erosion, perforation             Throat- Mallampati II , mucosa clear , drainage- none, tonsils- atrophic Neck- flexible , trachea midline, no stridor , thyroid nl, carotid no bruit Chest - symmetrical excursion , unlabored           Heart/CV- RRR , no murmur , no gallop  , no rub, nl s1 s2                           - JVD- none , edema- none,  stasis changes- none, varices- none           Lung- Clear, but with a persistent slight dry cough. Not wheezing., dullness-none, rub- none           Chest wall-  Abd-  Br/ Gen/ Rectal- Not done, not indicated Extrem- cyanosis- none, clubbing, none, atrophy- none, strength- nl Neuro- grossly intact to observation

## 2012-04-08 ENCOUNTER — Encounter: Payer: Self-pay | Admitting: Internal Medicine

## 2012-04-08 NOTE — Assessment & Plan Note (Signed)
IgE were related allergic reactions may be perennial and low grade, more significant if exposures especially heavy. Plan-immunoglobulin levels.

## 2012-04-13 ENCOUNTER — Encounter: Payer: Self-pay | Admitting: Internal Medicine

## 2012-04-20 ENCOUNTER — Telehealth: Payer: Self-pay | Admitting: Critical Care Medicine

## 2012-04-20 NOTE — Telephone Encounter (Signed)
Called spoke with patient who stated that she will not make an appt; is "not going to pay $50 to just come in and have the doctor tell her what medicine to take."  Pt stated that she will go to her PCP for these recommendations.  Apologized to pt for any inconvenience and asked her to please call if anything further is needed.  Pt thanked me and hung up.  Will sign and forward to PEW as FYI.

## 2012-04-20 NOTE — Telephone Encounter (Signed)
Do not reschedule this pt with this department.

## 2012-04-20 NOTE — Telephone Encounter (Signed)
An OV would be helpful to regroup and sort out. Difficult to do over the phone

## 2012-04-20 NOTE — Telephone Encounter (Signed)
Called spoke with patient who reports a dry cough, tickle in the back of her throat when taking a deep breath, sneezing and PND x3days.  Took 2 puffs of her xopenex earlier and this helped the tickle, making it tolerable.  Pt stated she has not used her qvar in the past 6 months b/c she "hasn't needed it."  Pt is asking if taking the xopenex was the correct plan of action of if she should have taken the qvar.  Pt stated that she will not begin taking the qvar daily and reiterated that she does not need it.    Pt was last seen by PEW 10.4.12 and rec'd to follow up in 1 month > cancelled on 11.8.12 and again on 12.13.  No pending appts.  Did keep allergy consult and allergy testing appts with [facility].  Dr Delford Field please advise, thanks.

## 2012-09-19 ENCOUNTER — Other Ambulatory Visit (HOSPITAL_COMMUNITY): Payer: Self-pay | Admitting: Family Medicine

## 2012-09-19 DIAGNOSIS — Z1231 Encounter for screening mammogram for malignant neoplasm of breast: Secondary | ICD-10-CM

## 2012-10-18 ENCOUNTER — Ambulatory Visit (HOSPITAL_COMMUNITY)
Admission: RE | Admit: 2012-10-18 | Discharge: 2012-10-18 | Disposition: A | Discharge status: Home / Self Care | Payer: BC Managed Care – PPO | Source: Ambulatory Visit | Attending: Family Medicine | Admitting: Family Medicine

## 2012-10-18 DIAGNOSIS — Z1231 Encounter for screening mammogram for malignant neoplasm of breast: Secondary | ICD-10-CM | POA: Insufficient documentation

## 2013-09-26 ENCOUNTER — Other Ambulatory Visit (HOSPITAL_COMMUNITY): Payer: Self-pay | Admitting: Family Medicine

## 2013-09-26 DIAGNOSIS — Z1231 Encounter for screening mammogram for malignant neoplasm of breast: Secondary | ICD-10-CM

## 2013-10-04 ENCOUNTER — Other Ambulatory Visit (HOSPITAL_COMMUNITY)
Admission: RE | Admit: 2013-10-04 | Discharge: 2013-10-04 | Disposition: A | Discharge status: Home / Self Care | Payer: BC Managed Care – PPO | Source: Ambulatory Visit | Attending: Nurse Practitioner | Admitting: Nurse Practitioner

## 2013-10-04 ENCOUNTER — Other Ambulatory Visit: Payer: Self-pay | Admitting: Nurse Practitioner

## 2013-10-04 ENCOUNTER — Other Ambulatory Visit: Payer: Self-pay | Admitting: Obstetrics and Gynecology

## 2013-10-04 DIAGNOSIS — Z01419 Encounter for gynecological examination (general) (routine) without abnormal findings: Secondary | ICD-10-CM | POA: Insufficient documentation

## 2013-10-04 DIAGNOSIS — Z1151 Encounter for screening for human papillomavirus (HPV): Secondary | ICD-10-CM | POA: Insufficient documentation

## 2013-10-19 ENCOUNTER — Ambulatory Visit (HOSPITAL_COMMUNITY)
Admission: RE | Admit: 2013-10-19 | Discharge: 2013-10-19 | Disposition: A | Discharge status: Home / Self Care | Payer: BC Managed Care – PPO | Source: Ambulatory Visit | Attending: Family Medicine | Admitting: Family Medicine

## 2013-10-19 DIAGNOSIS — Z1231 Encounter for screening mammogram for malignant neoplasm of breast: Secondary | ICD-10-CM | POA: Insufficient documentation

## 2014-11-07 ENCOUNTER — Other Ambulatory Visit (HOSPITAL_COMMUNITY): Payer: Self-pay | Admitting: Family Medicine

## 2014-11-07 DIAGNOSIS — Z1231 Encounter for screening mammogram for malignant neoplasm of breast: Secondary | ICD-10-CM

## 2014-11-22 ENCOUNTER — Ambulatory Visit (HOSPITAL_COMMUNITY)
Admission: RE | Admit: 2014-11-22 | Discharge: 2014-11-22 | Disposition: A | Discharge status: Home / Self Care | Payer: BC Managed Care – PPO | Source: Ambulatory Visit | Attending: Family Medicine | Admitting: Family Medicine

## 2014-11-22 DIAGNOSIS — Z1231 Encounter for screening mammogram for malignant neoplasm of breast: Secondary | ICD-10-CM | POA: Diagnosis not present

## 2015-04-23 ENCOUNTER — Other Ambulatory Visit (HOSPITAL_COMMUNITY)
Admission: RE | Admit: 2015-04-23 | Discharge: 2015-04-23 | Disposition: A | Discharge status: Home / Self Care | Payer: 59 | Source: Ambulatory Visit | Attending: Family Medicine | Admitting: Family Medicine

## 2015-04-23 ENCOUNTER — Other Ambulatory Visit: Payer: Self-pay | Admitting: Family Medicine

## 2015-04-23 DIAGNOSIS — Z01419 Encounter for gynecological examination (general) (routine) without abnormal findings: Secondary | ICD-10-CM | POA: Insufficient documentation

## 2015-04-24 LAB — CYTOLOGY - PAP

## 2015-11-04 ENCOUNTER — Other Ambulatory Visit: Payer: Self-pay

## 2015-11-04 DIAGNOSIS — Z1231 Encounter for screening mammogram for malignant neoplasm of breast: Secondary | ICD-10-CM

## 2015-12-16 ENCOUNTER — Ambulatory Visit
Admission: RE | Admit: 2015-12-16 | Discharge: 2015-12-16 | Disposition: A | Discharge status: Home / Self Care | Payer: BLUE CROSS/BLUE SHIELD | Source: Ambulatory Visit

## 2015-12-16 DIAGNOSIS — Z1231 Encounter for screening mammogram for malignant neoplasm of breast: Secondary | ICD-10-CM

## 2016-12-27 ENCOUNTER — Other Ambulatory Visit: Payer: Self-pay | Admitting: Family Medicine

## 2016-12-27 DIAGNOSIS — Z1231 Encounter for screening mammogram for malignant neoplasm of breast: Secondary | ICD-10-CM

## 2017-01-12 ENCOUNTER — Other Ambulatory Visit: Payer: Self-pay | Admitting: Orthopedic Surgery

## 2017-01-12 DIAGNOSIS — R531 Weakness: Secondary | ICD-10-CM

## 2017-01-12 DIAGNOSIS — R52 Pain, unspecified: Secondary | ICD-10-CM

## 2017-01-12 DIAGNOSIS — M7541 Impingement syndrome of right shoulder: Secondary | ICD-10-CM

## 2017-01-28 ENCOUNTER — Inpatient Hospital Stay
Admission: RE | Admit: 2017-01-28 | Discharge: 2017-01-28 | Disposition: A | Discharge status: Home / Self Care | Payer: BLUE CROSS/BLUE SHIELD | Source: Ambulatory Visit | Attending: Orthopedic Surgery | Admitting: Orthopedic Surgery

## 2017-02-02 ENCOUNTER — Encounter: Payer: Self-pay | Admitting: Radiology

## 2017-02-02 ENCOUNTER — Ambulatory Visit
Admission: RE | Admit: 2017-02-02 | Discharge: 2017-02-02 | Disposition: A | Discharge status: Home / Self Care | Payer: BLUE CROSS/BLUE SHIELD | Source: Ambulatory Visit | Attending: Family Medicine | Admitting: Family Medicine

## 2017-02-02 DIAGNOSIS — Z1231 Encounter for screening mammogram for malignant neoplasm of breast: Secondary | ICD-10-CM

## 2017-02-11 ENCOUNTER — Other Ambulatory Visit: Payer: BLUE CROSS/BLUE SHIELD

## 2017-06-14 ENCOUNTER — Other Ambulatory Visit (HOSPITAL_COMMUNITY)
Admission: RE | Admit: 2017-06-14 | Discharge: 2017-06-14 | Disposition: A | Discharge status: Home / Self Care | Payer: BLUE CROSS/BLUE SHIELD | Source: Ambulatory Visit | Attending: Family Medicine | Admitting: Family Medicine

## 2017-06-14 ENCOUNTER — Other Ambulatory Visit: Payer: Self-pay | Admitting: Family Medicine

## 2017-06-14 DIAGNOSIS — Z124 Encounter for screening for malignant neoplasm of cervix: Secondary | ICD-10-CM | POA: Diagnosis not present

## 2017-06-18 LAB — CYTOLOGY - PAP: DIAGNOSIS: NEGATIVE

## 2018-04-03 ENCOUNTER — Other Ambulatory Visit: Payer: Self-pay | Admitting: Family Medicine

## 2018-04-03 DIAGNOSIS — Z1231 Encounter for screening mammogram for malignant neoplasm of breast: Secondary | ICD-10-CM

## 2018-04-06 ENCOUNTER — Ambulatory Visit: Payer: BLUE CROSS/BLUE SHIELD

## 2018-04-28 ENCOUNTER — Ambulatory Visit
Admission: RE | Admit: 2018-04-28 | Discharge: 2018-04-28 | Disposition: A | Discharge status: Home / Self Care | Payer: BLUE CROSS/BLUE SHIELD | Source: Ambulatory Visit | Attending: Family Medicine | Admitting: Family Medicine

## 2018-04-28 DIAGNOSIS — Z1231 Encounter for screening mammogram for malignant neoplasm of breast: Secondary | ICD-10-CM

## 2018-07-31 ENCOUNTER — Encounter (INDEPENDENT_AMBULATORY_CARE_PROVIDER_SITE_OTHER): Payer: Self-pay

## 2018-07-31 ENCOUNTER — Ambulatory Visit (INDEPENDENT_AMBULATORY_CARE_PROVIDER_SITE_OTHER): Payer: BLUE CROSS/BLUE SHIELD | Admitting: Allergy and Immunology

## 2018-07-31 ENCOUNTER — Encounter: Payer: Self-pay | Admitting: Allergy and Immunology

## 2018-07-31 VITALS — BP 100/60 | HR 73 | Temp 98.2°F | Resp 20 | Ht 60.2 in | Wt 107.0 lb

## 2018-07-31 DIAGNOSIS — R05 Cough: Secondary | ICD-10-CM

## 2018-07-31 DIAGNOSIS — L309 Dermatitis, unspecified: Secondary | ICD-10-CM | POA: Diagnosis not present

## 2018-07-31 DIAGNOSIS — R053 Chronic cough: Secondary | ICD-10-CM

## 2018-07-31 DIAGNOSIS — J4540 Moderate persistent asthma, uncomplicated: Secondary | ICD-10-CM | POA: Diagnosis not present

## 2018-07-31 DIAGNOSIS — J310 Chronic rhinitis: Secondary | ICD-10-CM

## 2018-07-31 MED ORDER — TIOTROPIUM BROMIDE MONOHYDRATE 1.25 MCG/ACT IN AERS: 1.2500 | INHALATION_SPRAY | Freq: Every day | RESPIRATORY_TRACT | 5 refills | Status: AC

## 2018-07-31 MED ORDER — FLUTICASONE PROPIONATE 93 MCG/ACT NA EXHU: 2.0000 | INHALANT_SUSPENSION | Freq: Two times a day (BID) | NASAL | 5 refills | Status: AC

## 2018-07-31 NOTE — Assessment & Plan Note (Signed)
Unclear etiology.  Environmental and food allergen skin tests were negative today despite a positive histamine control.  If symptoms recurs, persist or progress, further dermatology evaluation is recommended.

## 2018-07-31 NOTE — Patient Instructions (Addendum)
Chronic rhinitis All seasonal and perennial aeroallergen skin tests are negative despite a positive histamine control.  Intranasal steroids, intranasal antihistamines, and first generation antihistamines are effective for symptoms associated with non-allergic rhinitis, whereas second generation antihistamines such as cetirizine (Zyrtec), loratadine (Claritin) and fexofenadine (Allegra) have been found to be ineffective for this condition.  A prescription has been provided for [facility], 2 actuations per nostril twice a day. Proper technique has been discussed and demonstrated.  Nasal saline lavage (NeilMed) has been recommended as needed and prior to medicated nasal sprays along with instructions for proper administration.  For thick post nasal drainage, add guaifenesin 1200 mg (Mucinex Maximum Strength)  twice daily as needed with adequate hydration as discussed.  Persistent asthma/cough variant asthma Todays spirometry results, assessed while asymptomatic, suggest under-perception of bronchoconstriction.  Inhalers with long-acting bronchodilators will not be provided given the patient's history of side effects.   A prescription has been provided for Spiriva Respimat 1.25 g, 2 inhalations daily.    Continue albuterol HFA, 1 to 2 inhalations every 6 hours if needed.  Subjective and objective measures of pulmonary function will be followed and the treatment plan will be adjusted accordingly.  Dermatitis Unclear etiology.  Environmental and food allergen skin tests were negative today despite a positive histamine control.  If symptoms recurs, persist or progress, further dermatology evaluation is recommended.   Return in about 6 weeks (around 09/11/2018), or if symptoms worsen or fail to improve.

## 2018-07-31 NOTE — Progress Notes (Signed)
New Patient Note  RE: Melissa Mcintyre MRN: [id number] DOB: November 10, 1966 Date of Office Visit: 07/31/2018  Referring provider: No ref. provider found Primary care provider: Harlan Stains, MD  Chief Complaint: Cough and Urticaria   History of present illness: Melissa Mcintyre is a 52 y.o. female presenting today for evaluation of coughing and hives. She complains that she has had a cough "forever".  The cough used to start with upper respiratory tract infections and would linger on as an "asthmatic cough" for a prolonged period of time.  Now, however, she experiences coughing with prolonged talking, cold air, and air conditioning.  She has had some chest tightness and wheezing associated with the coughing in the past, however that is not a major issue currently.  She does experience postnasal drainage, particularly at nighttime.  The cough itself does not seem to be worse at nighttime.  She currently attempts to control the cough with sporadic use of Qvar, however this medication is expensive, and cetirizine as needed, however this medication causes her to feel groggy. She also complains of welts which have developed on her neck and occiput.  Individual hives last for 2 to 3 weeks and are described as red, raised, and pruritic.  She had a biopsy performed with inconclusive results, however arthropod bite and allergic reaction could not be ruled out.  No specific medication, food, skin care product, detergent, soap, or other environmental triggers have been identified.  Assessment and plan: Chronic rhinitis All seasonal and perennial aeroallergen skin tests are negative despite a positive histamine control.  Intranasal steroids, intranasal antihistamines, and first generation antihistamines are effective for symptoms associated with non-allergic rhinitis, whereas second generation antihistamines such as cetirizine (Zyrtec), loratadine (Claritin) and fexofenadine (Allegra) have been found to be  ineffective for this condition.  A prescription has been provided for [facility], 2 actuations per nostril twice a day. Proper technique has been discussed and demonstrated.  Nasal saline lavage (NeilMed) has been recommended as needed and prior to medicated nasal sprays along with instructions for proper administration.  For thick post nasal drainage, add guaifenesin 1200 mg (Mucinex Maximum Strength)  twice daily as needed with adequate hydration as discussed.  Persistent asthma/cough variant asthma Todays spirometry results, assessed while asymptomatic, suggest under-perception of bronchoconstriction.  Inhalers with long-acting bronchodilators will not be provided given the patient's history of side effects.   A prescription has been provided for Spiriva Respimat 1.25 g, 2 inhalations daily.    Continue albuterol HFA, 1 to 2 inhalations every 6 hours if needed.  Subjective and objective measures of pulmonary function will be followed and the treatment plan will be adjusted accordingly.  Dermatitis Unclear etiology.  Environmental and food allergen skin tests were negative today despite a positive histamine control.  If symptoms recurs, persist or progress, further dermatology evaluation is recommended.   Meds ordered this encounter  Medications  . Fluticasone Propionate (XHANCE) 93 MCG/ACT EXHU    Sig: Place 2 sprays into the nose 2 (two) times daily.    Dispense:  16 mL    Refill:  5  . Tiotropium Bromide Monohydrate (SPIRIVA RESPIMAT) 1.25 MCG/ACT AERS    Sig: Inhale 1.25 Inhalers into the lungs daily.    Dispense:  1 Inhaler    Refill:  5    Diagnostics: Spirometry: FVC was 2.56 L and FEV1 was 2.25 L with 250 mL postbronchodilator improvement.  This study was performed while the patient was asymptomatic.  Please see scanned spirometry results  for details. Environmental skin testing: Negative despite a positive histamine control. Food allergen skin testing: Negative despite a  positive histamine control.    Physical examination: Blood pressure 100/60, pulse 73, temperature 98.2 F (36.8 C), temperature source Oral, resp. rate 20, height 5' 0.2" (1.529 m), weight 107 lb (48.5 kg), last menstrual period 09/09/2013, SpO2 97 %.  General: Alert, interactive, in no acute distress. HEENT: TMs pearly gray, turbinates moderately edematous with thick discharge, post-pharynx moderately erythematous. Neck: Supple without lymphadenopathy. Lungs: Clear to auscultation without wheezing, rhonchi or rales. CV: Normal S1, S2 without murmurs. Abdomen: Nondistended, nontender. Skin: Warm and dry, without lesions or rashes. Extremities:  No clubbing, cyanosis or edema. Neuro:   Grossly intact.  Review of systems:  Review of systems negative except as noted in HPI / PMHx or noted below: Review of Systems  Constitutional: Negative.   HENT: Negative.   Eyes: Negative.   Respiratory: Negative.   Cardiovascular: Negative.   Gastrointestinal: Negative.   Genitourinary: Negative.   Musculoskeletal: Negative.   Skin: Negative.   Neurological: Negative.   Endo/Heme/Allergies: Negative.   Psychiatric/Behavioral: Negative.     Past medical history:  Past Medical History:  Diagnosis Date  . Asthma   . Atypical nevi   . Genital herpes   . IBS (irritable bowel syndrome)   . Pain, upper back    intermittent  . Recurrent upper respiratory infection (URI)   . Shoulder pain    intermittent  . Urticaria     Past surgical history:  Past Surgical History:  Procedure Laterality Date  . BREAST EXCISIONAL BIOPSY Left 1980   benign  . CERVICAL CONE BIOPSY  age 56  . fibroadenoma removed from left breast  age 70  . moles removed  03/2010    Family history: Family History  Problem Relation Age of Onset  . Prostate cancer Father   . Hyperlipidemia Unknown   . Hypertension Mother   . Skin cancer Mother   . Asthma Mother   . Allergic rhinitis Mother   . Asthma Maternal  Uncle   . Asthma Paternal Uncle   . Eczema Neg Hx   . Urticaria Neg Hx     Social history: Social History   Socioeconomic History  . Marital status: Married    Spouse name: Not on file  . Number of children: 2  . Years of education: Not on file  . Highest education level: Not on file  Occupational History  . Not on file  Social Needs  . Financial resource strain: Not on file  . Food insecurity:    Worry: Not on file    Inability: Not on file  . Transportation needs:    Medical: Not on file    Non-medical: Not on file  Tobacco Use  . Smoking status: Never Smoker  . Smokeless tobacco: Never Used  Substance and Sexual Activity  . Alcohol use: Yes    Comment: 2 weekly  . Drug use: No  . Sexual activity: Not on file  Lifestyle  . Physical activity:    Days per week: Not on file    Minutes per session: Not on file  . Stress: Not on file  Relationships  . Social connections:    Talks on phone: Not on file    Gets together: Not on file    Attends religious service: Not on file    Active member of club or organization: Not on file    Attends meetings of clubs  or organizations: Not on file    Relationship status: Not on file  . Intimate partner violence:    Fear of current or ex partner: Not on file    Emotionally abused: Not on file    Physically abused: Not on file    Forced sexual activity: Not on file  Other Topics Concern  . Not on file  Social History Narrative  . Not on file   Environmental History: The patient lives in a 52 year old house with hardwood floors throughout, gas heat, and central air.  There are dogs and cats in the home which have access to her bedroom.  She is a non-smoker.  There is no known mold/water damage in the home.  Allergies as of 07/31/2018      Reactions   Adhesive [tape]    Demerol    vomiting   Flagyl [metronidazole]    Itching, joint swelling   Latex    Blistering and rashes      Medication List        Accurate as of  07/31/18  1:38 PM. Always use your most recent med list.          beclomethasone 80 MCG/ACT inhaler Commonly known as:  QVAR Inhale 2 puffs into the lungs as needed.   fluticasone 27.5 MCG/SPRAY nasal spray Commonly known as:  VERAMYST Place 2 sprays into the nose as needed.   fluticasone 50 MCG/ACT nasal spray Commonly known as:  FLONASE INSTILL 2 SPRAYS INTO EACH NOSTRIL QD   Fluticasone Propionate 93 MCG/ACT Exhu Place 2 sprays into the nose 2 (two) times daily.   levalbuterol 45 MCG/ACT inhaler Commonly known as:  XOPENEX HFA Inhale 2 puffs into the lungs every 4 (four) hours as needed for wheezing.   Tiotropium Bromide Monohydrate 1.25 MCG/ACT Aers Inhale 1.25 Inhalers into the lungs daily.   vitamin C 1000 MG tablet Take 1,000 mg by mouth as needed. 1-2 times weekly       Known medication allergies: Allergies  Allergen Reactions  . Adhesive [Tape]   . Demerol     vomiting  . Flagyl [Metronidazole]     Itching, joint swelling  . Latex     Blistering and rashes    I appreciate the opportunity to take part in Virgin's care. Please do not hesitate to contact me with questions.  Sincerely,   R. Edgar Frisk, MD

## 2018-07-31 NOTE — Assessment & Plan Note (Addendum)
Todays spirometry results, assessed while asymptomatic, suggest under-perception of bronchoconstriction.  Inhalers with long-acting bronchodilators will not be provided given the patient's history of side effects.   A prescription has been provided for Spiriva Respimat 1.25 g, 2 inhalations daily.    Continue albuterol HFA, 1 to 2 inhalations every 6 hours if needed.  Subjective and objective measures of pulmonary function will be followed and the treatment plan will be adjusted accordingly.

## 2018-07-31 NOTE — Assessment & Plan Note (Signed)
All seasonal and perennial aeroallergen skin tests are negative despite a positive histamine control.  Intranasal steroids, intranasal antihistamines, and first generation antihistamines are effective for symptoms associated with non-allergic rhinitis, whereas second generation antihistamines such as cetirizine (Zyrtec), loratadine (Claritin) and fexofenadine (Allegra) have been found to be ineffective for this condition.  A prescription has been provided for Xhance, 2 actuations per nostril twice a day. Proper technique has been discussed and demonstrated.    Nasal saline lavage (NeilMed) has been recommended as needed and prior to medicated nasal sprays along with instructions for proper administration.  For thick post nasal drainage, add guaifenesin 1200 mg (Mucinex Maximum Strength)  twice daily as needed with adequate hydration as discussed. 

## 2018-08-22 ENCOUNTER — Encounter: Payer: Self-pay | Admitting: Allergy and Immunology

## 2019-07-25 IMAGING — MG DIGITAL SCREENING BILATERAL MAMMOGRAM WITH TOMO AND CAD
8 series · 9 of 24 positions shown · non-contrast
Comparison: Previous exam(s).

CLINICAL DATA: Screening.

EXAM:
DIGITAL SCREENING BILATERAL MAMMOGRAM WITH TOMO AND CAD

[L MLO synth-2D]
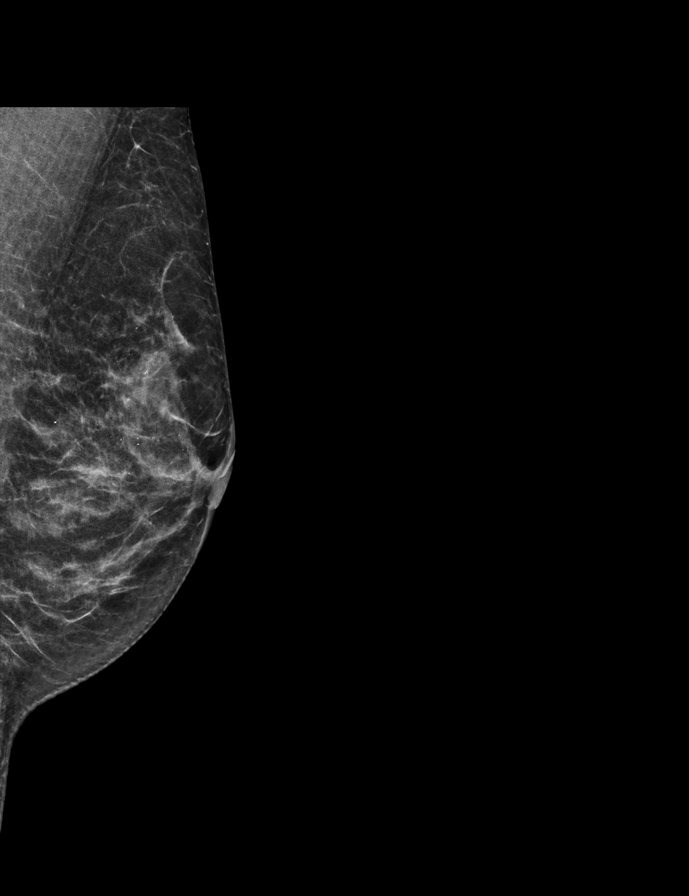

[L CC synth-2D]
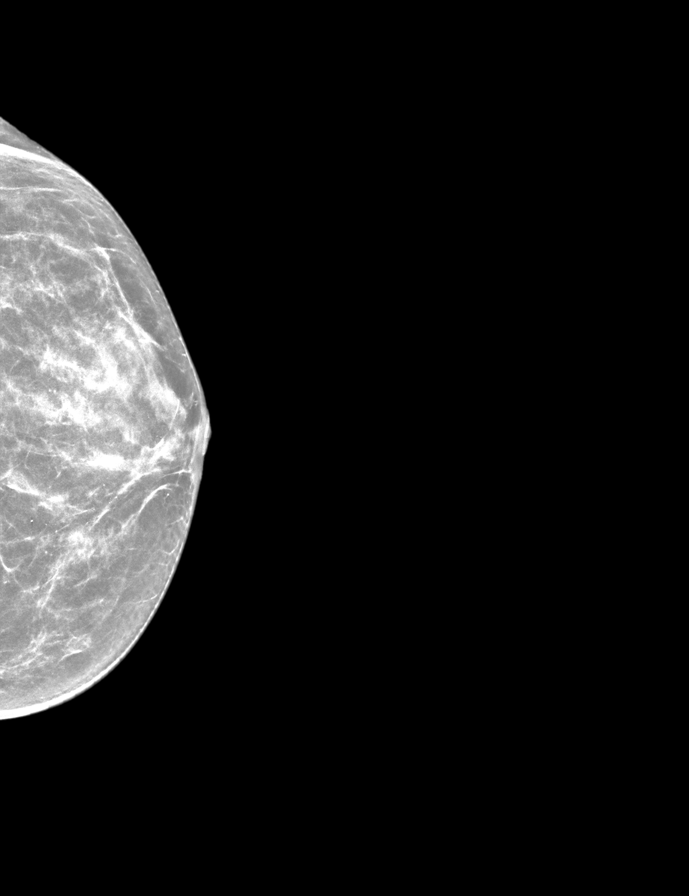

[R MLO synth-2D]
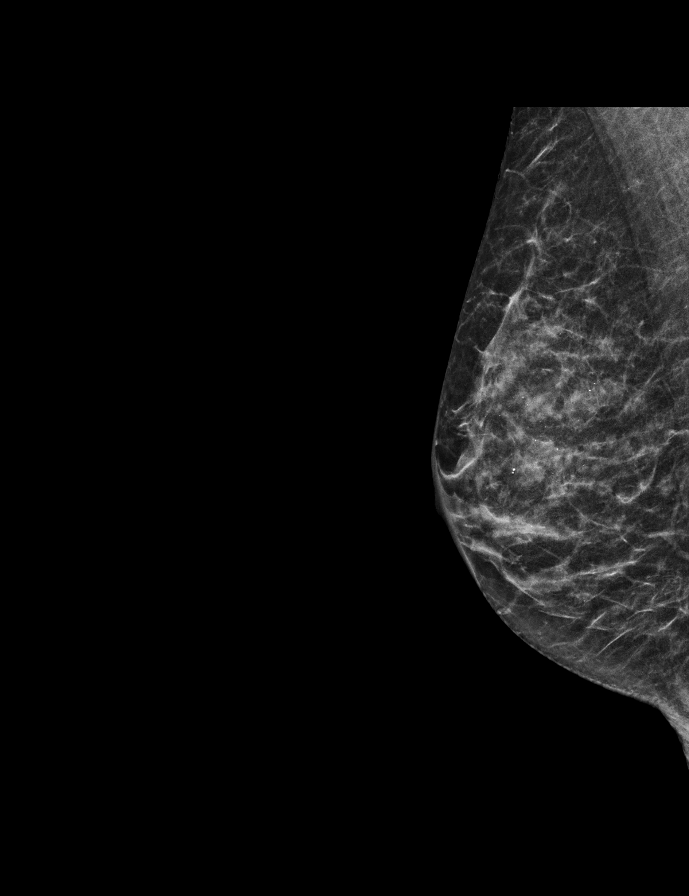

[R CC synth-2D]
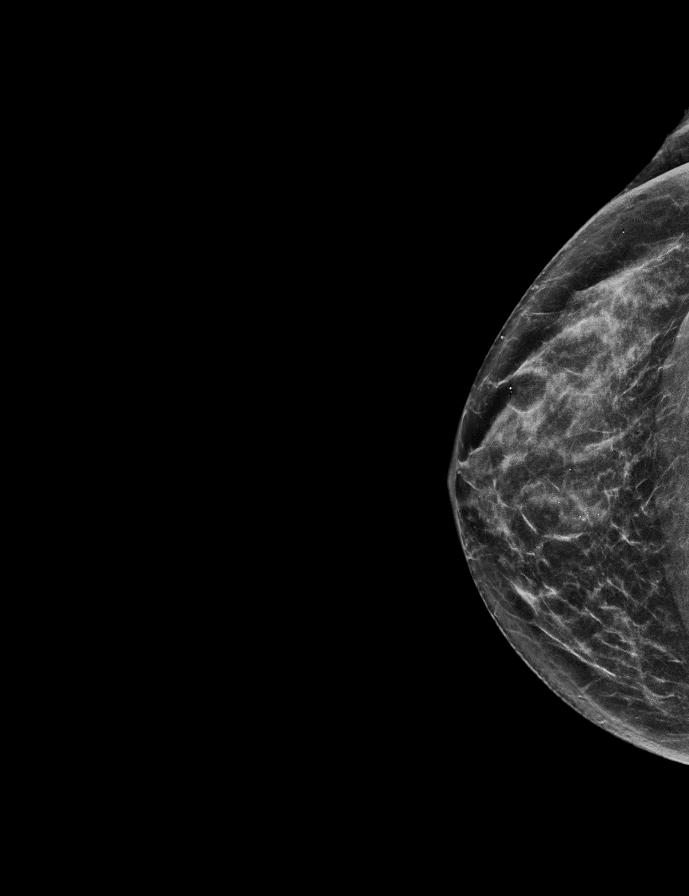

[R MLO tomo · 2 of 43 frames shown]
[frame 14/43]
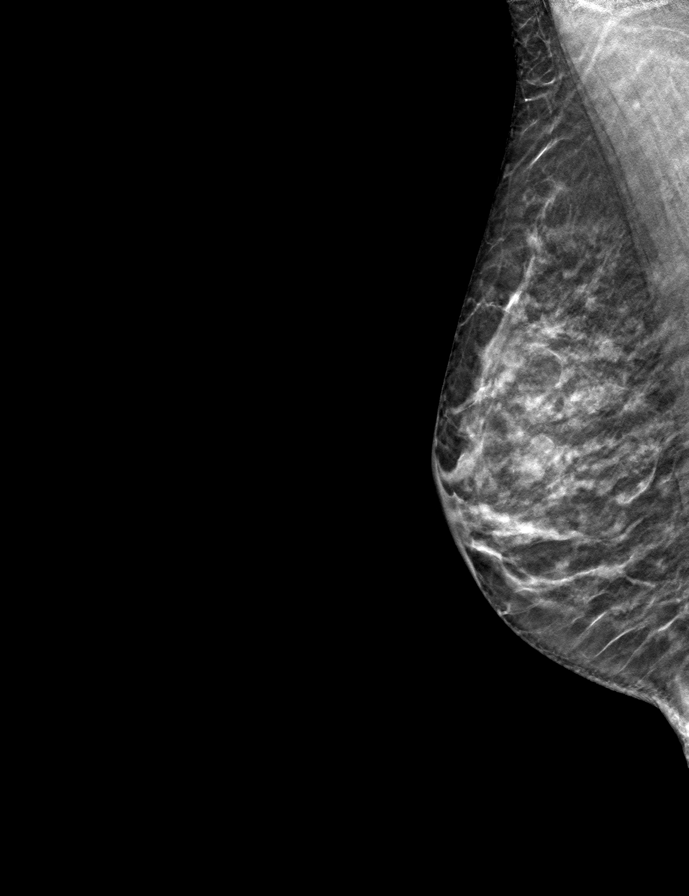
[frame 22/43]
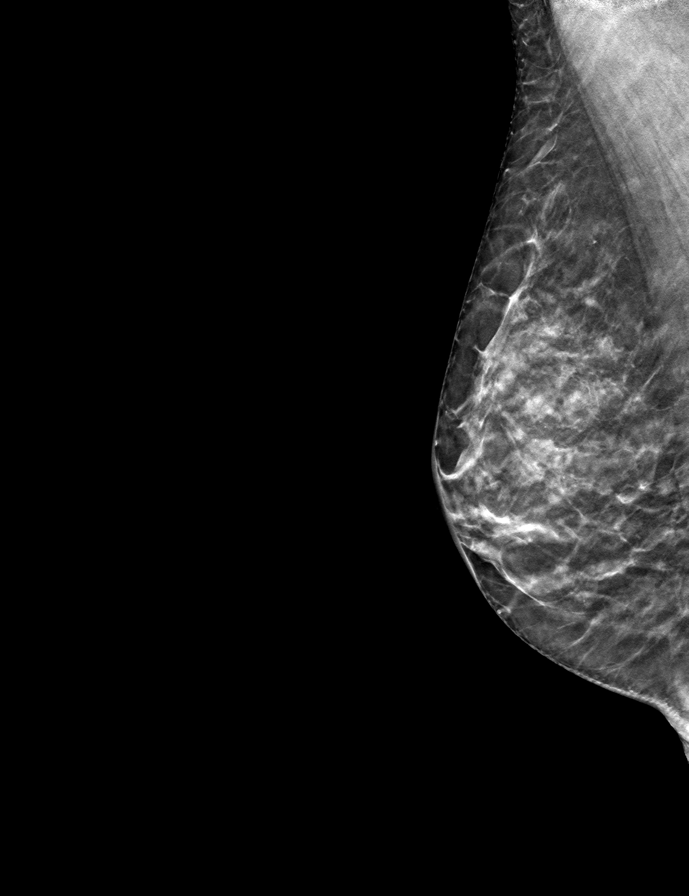

[L CC tomo · tomo slice 25/49.0]
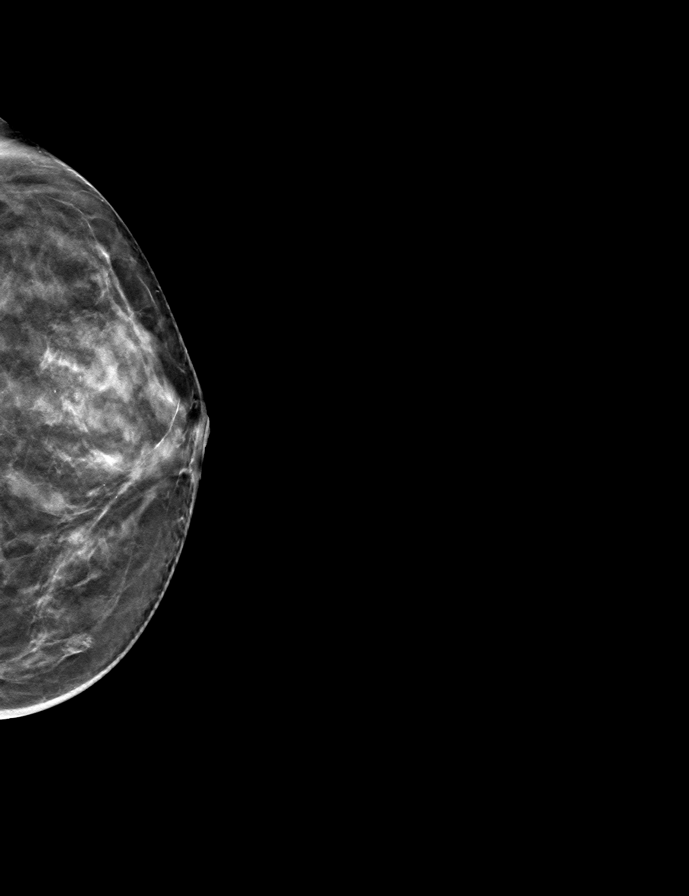

[L MLO tomo · tomo slice 23/46.0]
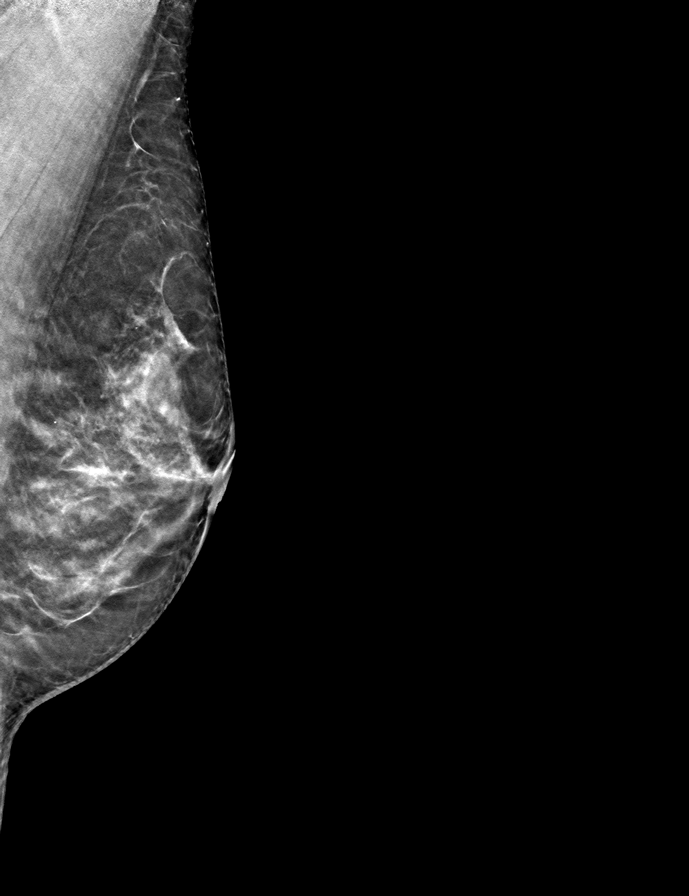

[R CC tomo · tomo slice 26/51.0]
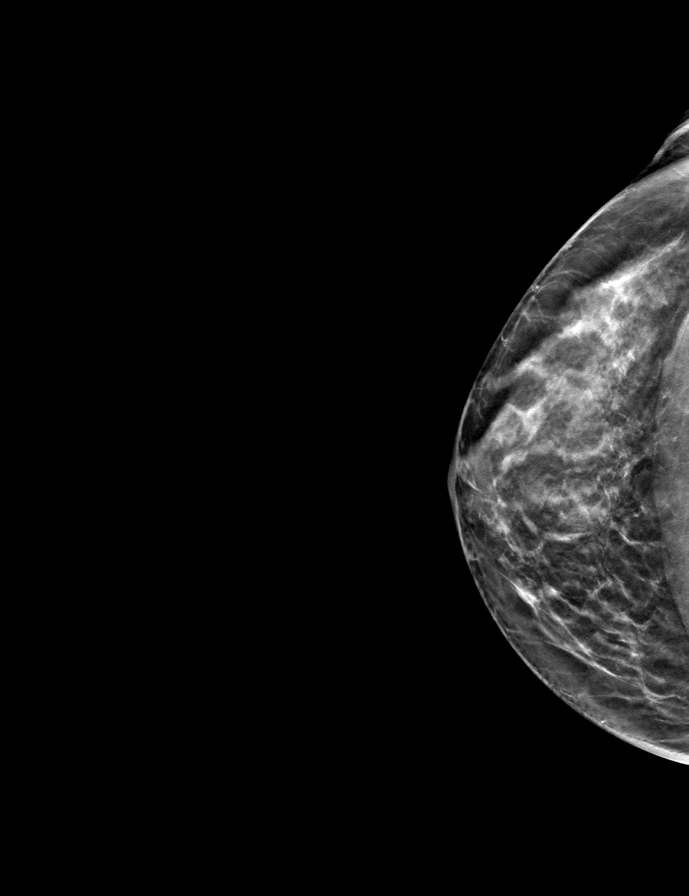

[9 of 24 positions shown; findings below may reference images not displayed]

ACR Breast Density Category c: The breast tissue is heterogeneously
dense, which may obscure small masses.
FINDINGS: There are no findings suspicious for malignancy. Images were
processed with CAD.
IMPRESSION: No mammographic evidence of malignancy. A result letter of this
screening mammogram will be mailed directly to the patient.

RECOMMENDATION:
Screening mammogram in one year. (Code:[id number]-[id number])

BI-RADS CATEGORY  1: Negative.
